# Patient Record
Sex: Female | Born: 1985 | Race: White | Hispanic: No | Marital: Single | Smoking: Current every day smoker
Health system: Southern US, Community
[De-identification: ages and names within clinical notes are randomized; demographics above are authoritative.]

## PROBLEM LIST (undated history)

## (undated) DIAGNOSIS — F329 Major depressive disorder, single episode, unspecified: Secondary | ICD-10-CM

## (undated) DIAGNOSIS — T7840XA Allergy, unspecified, initial encounter: Secondary | ICD-10-CM

## (undated) DIAGNOSIS — F32A Depression, unspecified: Secondary | ICD-10-CM

## (undated) DIAGNOSIS — F419 Anxiety disorder, unspecified: Secondary | ICD-10-CM

## (undated) DIAGNOSIS — F1910 Other psychoactive substance abuse, uncomplicated: Secondary | ICD-10-CM

## (undated) HISTORY — DX: Allergy, unspecified, initial encounter: T78.40XA

## (undated) HISTORY — DX: Other psychoactive substance abuse, uncomplicated: F19.10

## (undated) HISTORY — PX: APPENDECTOMY: SHX54

## (undated) HISTORY — DX: Major depressive disorder, single episode, unspecified: F32.9

## (undated) HISTORY — DX: Depression, unspecified: F32.A

## (undated) HISTORY — DX: Anxiety disorder, unspecified: F41.9

---

## 2015-07-25 ENCOUNTER — Ambulatory Visit (HOSPITAL_COMMUNITY)
Admission: RE | Admit: 2015-07-25 | Discharge: 2015-07-25 | Disposition: A | Discharge status: Home / Self Care | Payer: BLUE CROSS/BLUE SHIELD | Attending: Psychiatry | Admitting: Psychiatry

## 2015-07-25 DIAGNOSIS — Z915 Personal history of self-harm: Secondary | ICD-10-CM | POA: Insufficient documentation

## 2015-07-25 DIAGNOSIS — F1220 Cannabis dependence, uncomplicated: Secondary | ICD-10-CM | POA: Insufficient documentation

## 2015-07-25 DIAGNOSIS — F4321 Adjustment disorder with depressed mood: Secondary | ICD-10-CM | POA: Diagnosis present

## 2015-07-25 NOTE — BH Assessment (Signed)
Consulted with May Agustin, NP who recommends patient be discharged back to outpatient psychiatrist and follow-up with a therapist.  Patients girlfriend states that she "monitors" patient and feels that she is safe and Dr. Nolen MuMcKinney called and has spoke with the patient since being here at [facility]. Patient contracts for safety and provided information for mobile crisis and suicide prevention information.   Davina PokeJoVea Tadao Emig, LCSW Therapeutic Triage Specialist [facility] 07/25/2015 6:23 PM
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
## 2015-07-30 ENCOUNTER — Other Ambulatory Visit (HOSPITAL_COMMUNITY): Payer: BLUE CROSS/BLUE SHIELD | Attending: Psychiatry | Admitting: Psychiatry

## 2015-07-30 ENCOUNTER — Encounter (HOSPITAL_COMMUNITY): Payer: Self-pay | Admitting: Psychiatry

## 2015-07-30 DIAGNOSIS — G4710 Hypersomnia, unspecified: Secondary | ICD-10-CM | POA: Diagnosis not present

## 2015-07-30 DIAGNOSIS — F331 Major depressive disorder, recurrent, moderate: Secondary | ICD-10-CM | POA: Insufficient documentation

## 2015-07-30 DIAGNOSIS — F419 Anxiety disorder, unspecified: Secondary | ICD-10-CM | POA: Insufficient documentation

## 2015-07-30 NOTE — Addendum Note (Signed)
Addended by: Sheralyn BoatmanLARK, MARGUERITE P on: 07/30/2015 02:44 PM   Modules accepted: Medications

## 2015-07-30 NOTE — Progress Notes (Signed)
    Daily Group Progress Note  Program: IOP  Group Time: 9:00-10:30  Participation Level: Active  Behavioral Response: Appropriate  Type of Therapy:  Group Therapy  Summary of Progress: Pt. Met with case manager and psychiatrist for intake assessment.     Group Time: 10:30-12:00  Participation Level:  Active  Behavioral Response: Appropriate  Type of Therapy: Psycho-education Group  Summary of Progress: Pt. Presents as quiet, primarily flat affect, depressed. Pt. Shared that she moved to th area in June with her partner and misses her family. Pt. Watched the [employer] and participated in breath focused meditation exercise.   Eloise Levels, Ph.D., [facility]
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
## 2015-07-31 ENCOUNTER — Other Ambulatory Visit (HOSPITAL_COMMUNITY): Payer: BLUE CROSS/BLUE SHIELD | Admitting: Psychiatry

## 2015-07-31 DIAGNOSIS — F331 Major depressive disorder, recurrent, moderate: Secondary | ICD-10-CM | POA: Diagnosis not present

## 2015-08-01 ENCOUNTER — Other Ambulatory Visit (HOSPITAL_COMMUNITY): Payer: BLUE CROSS/BLUE SHIELD | Admitting: Psychiatry

## 2015-08-01 DIAGNOSIS — F331 Major depressive disorder, recurrent, moderate: Secondary | ICD-10-CM

## 2015-08-01 NOTE — Progress Notes (Signed)
    Daily Group Progress Note  Program: IOP  Group Time: 9:00-10:30  Participation Level: Minimal  Behavioral Response: Appropriate  Type of Therapy:  Group Therapy  Summary of Progress: Pt. Presented as quiet and guarded, does not share in group, primarily flat affect and disengaged from group process. Pt. Shared minimally regarding her past experience with DBT therapy.      Group Time: 10:30-12:00  Participation Level:  None  Behavioral Response: Appropriate  Type of Therapy: Psycho-education Group  Summary of Progress: Pt. Was excused from group due to previously scheduled appointment.  Dawn Brown, Dawn Brown, LPC

## 2015-08-01 NOTE — Progress Notes (Signed)
    Daily Group Progress Note  Program: IOP  Group Time: 9:00-10:30  Participation Level: Active  Behavioral Response: Appropriate  Type of Therapy:  Group Therapy  Summary of Progress: Pt. Presented with mildly brightened affect, primarily depressed with flat affect. Pt. Discussed that her job is stressor due to exposure to people with substance dependence. Pt. 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 Pt. Discussed relationship with individual therapist and would like referral to another therapist.     Group Time: 10:30-12:00  Participation Level:  Active  Behavioral Response: Appropriate  Type of Therapy: Psycho-education Group  Summary of Progress: Pt. Watched and discussed Haze RushingKristen Neff video about the components of self-compassion i.e. Kindness to self, recognition of common humanity, and mindfulness.  Shaune PollackBrown, Jennifer B, LPC

## 2015-08-02 ENCOUNTER — Other Ambulatory Visit (HOSPITAL_COMMUNITY): Payer: BLUE CROSS/BLUE SHIELD

## 2015-08-05 ENCOUNTER — Other Ambulatory Visit (HOSPITAL_COMMUNITY): Payer: BLUE CROSS/BLUE SHIELD | Admitting: Psychiatry

## 2015-08-06 ENCOUNTER — Other Ambulatory Visit (HOSPITAL_COMMUNITY): Payer: BLUE CROSS/BLUE SHIELD | Admitting: Psychiatry

## 2015-08-06 DIAGNOSIS — F331 Major depressive disorder, recurrent, moderate: Secondary | ICD-10-CM | POA: Diagnosis not present

## 2015-08-06 NOTE — Progress Notes (Signed)
    Daily Group Progress Note  Program: IOP  Group Time: 9:00-10:30  Participation Level: Minimal  Behavioral Response: Appropriate  Type of Therapy:  Group Therapy  Summary of Progress: Pt. Presents as quiet, flat affect, does not participate in group process. When asked how she was doing Pt. Reported "ok", appeared preoccupied by her cell phone.     Group Time: 10:30-12:00  Participation Level:  Minimal  Behavioral Response: Appropriate  Type of Therapy: Psycho-education Group  Summary of Progress: Pt. Watched video about seeking emotional support ("nail in forehead"). Pt. Continues to present with primarily flat affect, not sharing in group process.  Shaune PollackBrown, Jennifer B, LPC

## 2015-08-07 ENCOUNTER — Other Ambulatory Visit (HOSPITAL_COMMUNITY): Payer: BLUE CROSS/BLUE SHIELD | Admitting: Psychiatry

## 2015-08-07 DIAGNOSIS — F331 Major depressive disorder, recurrent, moderate: Secondary | ICD-10-CM | POA: Diagnosis not present

## 2015-08-08 ENCOUNTER — Other Ambulatory Visit (HOSPITAL_COMMUNITY): Payer: BLUE CROSS/BLUE SHIELD

## 2015-08-08 NOTE — Progress Notes (Signed)
    Daily Group Progress Note  Program: IOP  Group Time: 9:00-10:30  Participation Level: Active  Behavioral Response: Appropriate  Type of Therapy:  Group Therapy  Summary of Progress: Pt. Presents with primarily flat affect, depressed, challenged to engage in the group process. Pt. 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     Group Time: 10:30-12:00  Participation Level:  Active  Behavioral Response: Appropriate  Type of Therapy: Psycho-education Group  Summary of Progress: Pt. Participated in discussion about developing a meditation practice and instruction about RAIN meditation method i.e., recognize, allow, investigate, and non-identification.  Shaune PollackBrown, Jennifer B, LPC

## 2015-08-09 ENCOUNTER — Other Ambulatory Visit (HOSPITAL_COMMUNITY): Payer: BLUE CROSS/BLUE SHIELD

## 2015-08-13 ENCOUNTER — Other Ambulatory Visit (HOSPITAL_COMMUNITY): Payer: BLUE CROSS/BLUE SHIELD

## 2015-08-14 ENCOUNTER — Other Ambulatory Visit (HOSPITAL_COMMUNITY): Payer: BLUE CROSS/BLUE SHIELD

## 2015-08-15 ENCOUNTER — Other Ambulatory Visit (HOSPITAL_COMMUNITY): Payer: BLUE CROSS/BLUE SHIELD | Admitting: Psychiatry

## 2015-08-15 DIAGNOSIS — F331 Major depressive disorder, recurrent, moderate: Secondary | ICD-10-CM

## 2015-08-15 NOTE — Progress Notes (Signed)
Patient ID: Dawn Brown, female   DOB: 01/02/1986, 29 y.o.   MRN: [id number] Discharge Note  Patient:  Dawn Brown is an 29 y.o., female DOB:  01/10/1986  Date of Admission:  07/30/2015  Date of Discharge:  08/16/2015  Reason for Admission:depression  IOP Course:  Ms Dawn Brown attended and participated in groups. She says there is really no change from when she was admitted. She feels the same but it was helpful to have a place to come where she felt safe and accepted every day.  She has an upcoming appointment with her psychiatrist as well as with a therapist and believes they might be helpful.  She had a good trip to [place].  She believes if she can stick it out things will get better for her.  She still has suicidal thoughts but no plan or intent  Mental Status at Discharge:still depressed. Still has suicidal but no plan or intent.  Lab Results: No results found for this or any previous visit (from the past 48 hour(s)).   Current outpatient prescriptions:  .  LORazepam (ATIVAN) 2 MG tablet, Take 2 mg by mouth every 6 (six) hours as needed for anxiety., Disp: , Rfl:  .  lurasidone (LATUDA) 40 MG TABS tablet, Take 60 mg by mouth daily with breakfast. Take 60 mg daily, Disp: , Rfl:  .  sertraline (ZOLOFT) 50 MG tablet, Take 50 mg by mouth daily., Disp: , Rfl:   Axis Diagnosis:  Major depression, recurrent, severe without psychotic features   Level of Care:  IOP  Discharge destination:  Other:  has appointments with her psychiatrist and a therapist  Is patient on multiple antipsychotic therapies at discharge:  No    Has Patient had three or more failed trials of antipsychotic monotherapy by history:  Negative  Patient phone:  [phone] (home)  Patient address:   [address] [place] [address],   Follow-up recommendations:  Activity:  continue current activity Diet:  continue current diet  Comments:  none  The patient received suicide prevention pamphlet:   Yes Belongings returned:    Dawn Brown 08/15/2015, 9:27 AM

## 2015-08-15 NOTE — Progress Notes (Signed)
    Daily Group Progress Note  Program: IOP  Group Time: 0900-1045  Participation Level: Minimal  Behavioral Response: Appropriate and Passive-Aggressive  Type of Therapy:  Group Therapy  Summary of Progress: Patient voiced that she is no better by being in MH-IOP.  Only spoke when called upon.  Writer discovered that pt hadn't shared her story as to why she's in MH-IOP.  Patients wanted to know why pt was attending.  Encouraged pt to share.  Pt stated it was too long of a story, but did share reader's digest version.     Group Time: 1100-1200  Participation Level:  Minimal  Behavioral Response: Passive-Aggressive  Type of Therapy: Psycho-education Group  Summary of Progress: Warning Signals of Relapse:  Discussed the various signs and signals of relapse and steps to take towards recovery.  Then later guest speaker:  Chester HolsteinStephanie Rhodes, RN, CPSS [facility]) discussed what her organization provides for free within the community.  Mentioned the orientation process and was able to schedule an appointment.  Also, she went on to share her story after answering several questions.      Jeri Modenaita Savan Ruta, M.Ed, CNA

## 2015-08-16 ENCOUNTER — Telehealth (HOSPITAL_COMMUNITY): Payer: Self-pay | Admitting: Psychiatry

## 2015-08-16 ENCOUNTER — Other Ambulatory Visit (HOSPITAL_COMMUNITY): Payer: BLUE CROSS/BLUE SHIELD | Admitting: Psychiatry

## 2015-08-16 DIAGNOSIS — F331 Major depressive disorder, recurrent, moderate: Secondary | ICD-10-CM | POA: Diagnosis not present

## 2015-08-16 NOTE — Patient Instructions (Signed)
Pt didn't return to MH-IOP for discharge, nor did she call.  F/U with Dr. Nolen MuMcKinney 08-21-15 @ 10 45 a.m and Forde RadonLeAnne Yates, LPC on 09-09-15 @ 9 a.m.  Encouraged support groups; especially DBT.  Return to work on 08-17-15; without any restrictions.

## 2015-08-16 NOTE — Progress Notes (Signed)
Dawn PeckKristen Brown is a 29 yo, divorced, employed, Caucasian female who was referred per TTS; treatment for worsening depressive and anxiety symptoms. Pt admitted to vague SI, no plan or intent. Admitted to previous suicide attempts. Hx of self mutilative behaviors (cutting and burning). Reported hx of twelve inpatient psychiatric hospitalizations due to depression and suicide gestures. Pt was able to contract for safety upon admission. Denied HI or A/V hallucinations. 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 Pt states the job is very taxing and emotional because there isn't enough resources for the patients. Also, mentioned that the addict patients trigger her recovery. 2) Family: States she is missing her family in [place]. [place] with [place] partner of one yr, after she moved here in April 2016.  Pt was scheduled to complete MH-IOP today, but she didn't return nor did she call.  Placed call to pt, but there was no answer.  Left f/u appts on her vm.  Yesterday was patient's first day sharing a little in the group for the first time.  Pt stated yesterday that the groups weren't helpful and she isn't any better.  Denied SI/HI or A/V hallucinations yesterday.  A:  D/C today.  F/U with Dr. Nolen MuMcKinney on 08-21-15 @ 1045 and Forde RadonLeAnne Yates, [facility] on 09-09-15 @ 9 a.m.  Encouraged support groups; especially DBT groups.  R:  Pt receptive.      Chestine SporeLARK, RITA, M.Ed, CNA

## 2015-08-20 ENCOUNTER — Other Ambulatory Visit (HOSPITAL_COMMUNITY): Payer: BLUE CROSS/BLUE SHIELD

## 2015-09-09 ENCOUNTER — Ambulatory Visit (HOSPITAL_COMMUNITY): Payer: BLUE CROSS/BLUE SHIELD | Admitting: Psychology

## 2015-10-31 ENCOUNTER — Ambulatory Visit (INDEPENDENT_AMBULATORY_CARE_PROVIDER_SITE_OTHER): Payer: BLUE CROSS/BLUE SHIELD | Admitting: Urgent Care

## 2015-10-31 VITALS — BP 118/80 | HR 67 | Temp 98.3°F | Resp 16 | Ht 71.0 in | Wt 191.0 lb

## 2015-10-31 DIAGNOSIS — Z862 Personal history of diseases of the blood and blood-forming organs and certain disorders involving the immune mechanism: Secondary | ICD-10-CM

## 2015-10-31 DIAGNOSIS — Z111 Encounter for screening for respiratory tuberculosis: Secondary | ICD-10-CM

## 2015-10-31 DIAGNOSIS — F418 Other specified anxiety disorders: Secondary | ICD-10-CM

## 2015-10-31 DIAGNOSIS — F32A Depression, unspecified: Secondary | ICD-10-CM

## 2015-10-31 DIAGNOSIS — F419 Anxiety disorder, unspecified: Secondary | ICD-10-CM

## 2015-10-31 DIAGNOSIS — Z0000 Encounter for general adult medical examination without abnormal findings: Secondary | ICD-10-CM

## 2015-10-31 DIAGNOSIS — F329 Major depressive disorder, single episode, unspecified: Secondary | ICD-10-CM

## 2015-10-31 LAB — COMPREHENSIVE METABOLIC PANEL
ALT: 20 U/L (ref 6–29)
AST: 18 U/L (ref 10–30)
Albumin: 4.5 g/dL (ref 3.6–5.1)
Alkaline Phosphatase: 58 U/L (ref 33–115)
BUN: 15 mg/dL (ref 7–25)
CHLORIDE: 103 mmol/L (ref 98–110)
CO2: 27 mmol/L (ref 20–31)
CREATININE: 0.76 mg/dL (ref 0.50–1.10)
Calcium: 9.9 mg/dL (ref 8.6–10.2)
GLUCOSE: 97 mg/dL (ref 65–99)
Potassium: 4.3 mmol/L (ref 3.5–5.3)
SODIUM: 137 mmol/L (ref 135–146)
Total Bilirubin: 0.3 mg/dL (ref 0.2–1.2)
Total Protein: 6.9 g/dL (ref 6.1–8.1)

## 2015-10-31 LAB — CBC
HCT: 38.2 % (ref 36.0–46.0)
Hemoglobin: 12.7 g/dL (ref 12.0–15.0)
MCH: 28.7 pg (ref 26.0–34.0)
MCHC: 33.2 g/dL (ref 30.0–36.0)
MCV: 86.2 fL (ref 78.0–100.0)
MPV: 10.5 fL (ref 8.6–12.4)
PLATELETS: 217 10*3/uL (ref 150–400)
RBC: 4.43 MIL/uL (ref 3.87–5.11)
RDW: 13.9 % (ref 11.5–15.5)
WBC: 6 10*3/uL (ref 4.0–10.5)

## 2015-10-31 LAB — FOLATE: Folate: 7.8 ng/mL (ref >5.4)

## 2015-10-31 LAB — FERRITIN: Ferritin: 53 ng/mL (ref 10–154)

## 2015-10-31 LAB — LIPID PANEL
CHOL/HDL RATIO: 2.9 ratio (ref <=5.0)
Cholesterol: 169 mg/dL (ref 125–200)
HDL: 59 mg/dL (ref >=46)
LDL Cholesterol: 97 mg/dL (ref <130)
Triglycerides: 67 mg/dL (ref <150)
VLDL: 13 mg/dL (ref <30)

## 2015-10-31 LAB — TSH: TSH: 1.55 mIU/L

## 2015-10-31 LAB — VITAMIN B12: Vitamin B-12: 1161 pg/mL — ABNORMAL HIGH (ref 200–1100)

## 2015-10-31 NOTE — Progress Notes (Signed)
  Tuberculosis Risk Questionnaire  1. No Were you born outside the [place] in one of the following parts of the world: [place], [place], [place], [place] or [place]?    2. No Have you traveled outside the [place] and lived for more than one month in one of the following parts of the world: [place], [place], [place], [place] or [place]?    3. No Do you have a compromised immune system such as from any of the following conditions:HIV/AIDS, organ or bone marrow transplantation, diabetes, immunosuppressive medicines (e.g. Prednisone, Remicaide), leukemia, lymphoma, cancer of the head or neck, gastrectomy or jejunal bypass, end-stage renal disease (on dialysis), or silicosis?     4.yes Have you ever or do you plan on working in: a residential care center, a health care facility, a jail or prison or homeless shelter?    5. no Have you ever: injected illegal drugs, used crack cocaine, lived in a homeless shelter  or been in jail or prison?     6. No Have you ever been exposed to anyone with infectious tuberculosis?    Tuberculosis Symptom Questionnaire  Do you currently have any of the following symptoms?  1. No Unexplained cough lasting more than 3 weeks?   2. No Unexplained fever lasting more than 3 weeks.   3. No Night Sweats (sweating that leaves the bedclothes and sheets wet)     4. No Shortness of Breath   5. No Chest Pain   6. No Unintentional weight loss    7. No Unexplained fatigue (very tired for no reason)

## 2015-10-31 NOTE — Progress Notes (Signed)
MRN: [id number]  Subjective:   Dawn Brown is a 30 y.o. female presenting for annual physical exam and Tb screen.  Medical care team includes: PCP: Astrid Divine, MD, plans on establishing care with Dr. Valentina Lucks in June 2017.  Vision: No visual deficits, does not see an eye doctor. Dental: Has dental insurance, does not get dental care. OB/GYN: Last pap smear completed 2015, was normal. She has never had an abnormal pap smear.   Specialists: Psychiatry - sees Dr. Nolen Mu. Treats patient for depression and anxiety. Managed with Latuda, Ativan and Zoloft. Her psychiatrist requested blood work for today's annual exam.  Patient works with pre-schoolers. She needs tuberculosis screening. She is currently living with her girlfriend, divorced 2010. She has good support network. Eats mixed diet, does not exercise. Smokes 1/2 ppd for the past 11 years. Not interested in quitting. Has ~2 drinks of alcohol per week. Does not use any drugs.   Dawn Brown has Depression, major, recurrent, moderate (HCC) on her problem list.  Dawn Brown has a current medication list which includes the following prescription(s): lorazepam, lurasidone, and sertraline. She is allergic to penicillins.  Dawn Brown  has a past medical history of Depression; Anxiety; Allergy; and Substance abuse. Also  has past surgical history that includes Appendectomy.  family history includes Alcohol abuse in her paternal grandfather; Cancer in her paternal grandfather; Diabetes in her paternal grandmother; Heart disease in her maternal grandfather, maternal grandmother, and paternal grandmother.  Immunizations: TDAP 2015  Review of Systems  Constitutional: Negative for fever, chills, weight loss, malaise/fatigue and diaphoresis.  HENT: Negative for congestion, ear discharge, ear pain, hearing loss, nosebleeds, sore throat and tinnitus.   Eyes: Negative for blurred vision, double vision, photophobia, pain, discharge and redness.   Respiratory: Negative for cough, shortness of breath and wheezing.   Cardiovascular: Negative for chest pain, palpitations and leg swelling.  Gastrointestinal: Negative for nausea, vomiting, abdominal pain, diarrhea, constipation and blood in stool.  Genitourinary: Negative for dysuria, urgency, frequency, hematuria and flank pain.  Musculoskeletal: Negative for myalgias, back pain and joint pain.  Skin: Negative for itching and rash.  Neurological: Negative for dizziness, tingling, seizures, loss of consciousness, weakness and headaches.  Endo/Heme/Allergies: Negative for polydipsia.  Psychiatric/Behavioral: Negative for depression, suicidal ideas, hallucinations, memory loss and substance abuse. The patient is not nervous/anxious and does not have insomnia.    Objective:   Vitals: BP 118/80 mmHg  Pulse 67  Temp(Src) 98.3 F (36.8 C) (Oral)  Resp 16  Ht  (1.803 m)  Wt 191 lb (86.637 kg)  BMI 26.65 kg/m2  SpO2 97%  LMP 10/29/2015 (Exact Date)  Physical Exam  Constitutional: She is oriented to person, place, and time. She appears well-developed and well-nourished.  HENT:  TM's intact bilaterally, no effusions or erythema. Nasal turbinates pink and moist, nasal passages patent. No sinus tenderness. Oropharynx clear, mucous membranes moist, dentition in good repair. Patient has lower lip ring.  Eyes: Conjunctivae and EOM are normal. Pupils are equal, round, and reactive to light. Right eye exhibits no discharge. Left eye exhibits no discharge. No scleral icterus.  Neck: Normal range of motion. Neck supple. No thyromegaly present.  Cardiovascular: Normal rate, regular rhythm and intact distal pulses.  Exam reveals no gallop and no friction rub.   No murmur heard. Pulmonary/Chest: No respiratory distress. She has no wheezes. She has no rales.  Abdominal: Soft. Bowel sounds are normal. She exhibits no distension and no mass. There is no tenderness.  Musculoskeletal: Normal  range  of motion. She exhibits no edema or tenderness.  Lymphadenopathy:    She has no cervical adenopathy.  Neurological: She is alert and oriented to person, place, and time. She has normal reflexes.  Skin: Skin is warm and dry. No rash noted. No erythema. No pallor.  Tattoos on both forearms.  Psychiatric: She has a normal mood and affect.   Assessment and Plan :   1. Annual physical exam - Patient is medically healthy. Counseled on risks of smoking. She is not interested in quitting. Discussed healthy lifestyle, diet, exercise, preventative care, vaccinations, and addressed patient's concerns.   2. Anxiety and depression - Continue f/u with Dr. Nolen MuMcKinney, labs pending. Patient will request results when she returns for her PPD reading.  3. History of anemia - Labs pending.  4. Screening for tuberculosis - TB Skin Test   Wallis BambergMario Illyria Sobocinski, PA-C [facility] [phone] 10/31/2015  9:48 AM

## 2015-10-31 NOTE — Patient Instructions (Addendum)
   IF you received an x-ray today, you will receive an invoice from [facility]. Please contact [facility] at [phone] with questions or concerns regarding your invoice.   IF you received labwork today, you will receive an invoice from [facility]. Please contact [facility] at [phone] with questions or concerns regarding your invoice.   Our billing staff will not be able to assist you with questions regarding bills from these companies.  You will be contacted with the lab results as soon as they are available. The fastest way to get your results is to activate your My Chart account. Instructions are located on the last page of this paperwork. If you have not heard from us regarding the results in 2 weeks, please contact this office.     Keeping You Healthy  Get These Tests 1. Blood Pressure- Have your blood pressure checked once a year by your health care provider.  Normal blood pressure is 120/80. 2. Weight- Have your body mass index (BMI) calculated to screen for obesity.  BMI is measure of body fat based on height and weight.  You can also calculate your own BMI at www.nhlbisupport.com/bmi/. 3. Cholesterol- Have your cholesterol checked every 5 years starting at age 20 then yearly starting at age 45. 4. Chlamydia, HIV, and other sexually transmitted diseases- Get screened every year until age 25, then within three months of each new sexual provider. 5. Pap Test - Every 1-5 years; discuss with your health care provider. 6. Mammogram- Every 1-2 years starting at age 40--50  Take these medicines  Calcium with Vitamin D-Your body needs 1200 mg of Calcium each day and 800-1000 IU of Vitamin D daily.  Your body can only absorb 500 mg of Calcium at a time so Calcium must be taken in 2 or 3 divided doses throughout the day.  Multivitamin with folic acid- Once daily if it is possible for you to become pregnant.  Get these  Immunizations  Gardasil-Series of three doses; prevents HPV related illness such as genital warts and cervical cancer.  Menactra-Single dose; prevents meningitis.  Tetanus shot- Every 10 years.  Flu shot-Every year.  Take these steps 1. Do not smoke-Your healthcare provider can help you quit.  For tips on how to quit go to www.smokefree.gov or call 1-800 QUITNOW. 2. Be physically active- Exercise 5 days a week for at least 30 minutes.  If you are not already physically active, start slow and gradually work up to 30 minutes of moderate physical activity.  Examples of moderate activity include walking briskly, dancing, swimming, bicycling, etc. 3. Breast Cancer- A self breast exam every month is important for early detection of breast cancer.  For more information and instruction on self breast exams, ask your healthcare provider or www.womenshealth.gov/faq/breast-self-exam.cfm. 4. Eat a healthy diet- Eat a variety of healthy foods such as fruits, vegetables, whole grains, low fat milk, low fat cheeses, yogurt, lean meats, poultry and fish, beans, nuts, tofu, etc.  For more information go to www. Thenutritionsource.org 5. Drink alcohol in moderation- Limit alcohol intake to one drink or less per day. Never drink and drive. 6. Depression- Your emotional health is as important as your physical health.  If you're feeling down or losing interest in things you normally enjoy please talk to your healthcare provider about being screened for depression. 7. Dental visit- Brush and floss your teeth twice daily; visit your dentist twice a year. 8. Eye doctor- Get an eye exam at least every   2 years. 9. Helmet use- Always wear a helmet when riding a bicycle, motorcycle, rollerblading or skateboarding. 10. Safe sex- If you may be exposed to sexually transmitted infections, use a condom. 11. Seat belts- Seat belts can save your live; always wear one. 12. Smoke/Carbon Monoxide detectors- These detectors need to  be installed on the appropriate level of your home. Replace batteries at least once a year. 13. Skin cancer- When out in the sun please cover up and use sunscreen 15 SPF or higher. 14. Violence- If anyone is threatening or hurting you, please tell your healthcare provider.        

## 2015-11-02 ENCOUNTER — Ambulatory Visit (INDEPENDENT_AMBULATORY_CARE_PROVIDER_SITE_OTHER): Payer: BLUE CROSS/BLUE SHIELD | Admitting: *Deleted

## 2015-11-02 DIAGNOSIS — Z111 Encounter for screening for respiratory tuberculosis: Secondary | ICD-10-CM

## 2015-11-02 LAB — TB SKIN TEST
INDURATION: 0 mm
TB Skin Test: NEGATIVE

## 2015-11-02 NOTE — Patient Instructions (Signed)
     IF you received an x-ray today, you will receive an invoice from [facility]. Please contact [facility] at [phone] with questions or concerns regarding your invoice.   IF you received labwork today, you will receive an invoice from [facility]. Please contact [facility] at [phone] with questions or concerns regarding your invoice.   Our billing staff will not be able to assist you with questions regarding bills from these companies.  You will be contacted with the lab results as soon as they are available. The fastest way to get your results is to activate your My Chart account. Instructions are located on the last page of this paperwork. If you have not heard from us regarding the results in 2 weeks, please contact this office.      

## 2015-11-04 LAB — VITAMIN D 1,25 DIHYDROXY
VITAMIN D 1, 25 (OH) TOTAL: 73 pg/mL — AB (ref 18–72)
VITAMIN D3 1, 25 (OH): 73 pg/mL
Vitamin D2 1, 25 (OH)2: <8 pg/mL

## 2015-11-08 ENCOUNTER — Encounter: Payer: Self-pay | Admitting: *Deleted

## 2015-12-03 ENCOUNTER — Telehealth: Payer: Self-pay

## 2015-12-03 NOTE — Telephone Encounter (Signed)
Pt  Dropped off pe form to be completed from physical she had 2 wks ago Pt  Will pick up   Best phone for pt is [phone]

## 2015-12-03 NOTE — Telephone Encounter (Signed)
Placed in Mani's box.

## 2015-12-04 NOTE — Telephone Encounter (Signed)
Completed and placed in to be faxed bin

## 2016-01-27 ENCOUNTER — Ambulatory Visit (INDEPENDENT_AMBULATORY_CARE_PROVIDER_SITE_OTHER): Payer: BLUE CROSS/BLUE SHIELD

## 2016-01-27 ENCOUNTER — Ambulatory Visit (INDEPENDENT_AMBULATORY_CARE_PROVIDER_SITE_OTHER): Payer: BLUE CROSS/BLUE SHIELD | Admitting: Family Medicine

## 2016-01-27 VITALS — BP 124/82 | HR 88 | Temp 97.2°F | Resp 17 | Ht 71.0 in | Wt 193.0 lb

## 2016-01-27 DIAGNOSIS — M25571 Pain in right ankle and joints of right foot: Secondary | ICD-10-CM | POA: Diagnosis not present

## 2016-01-27 DIAGNOSIS — S8261XA Displaced fracture of lateral malleolus of right fibula, initial encounter for closed fracture: Secondary | ICD-10-CM

## 2016-01-27 NOTE — Progress Notes (Signed)
Patient ID: Dawn Brown, female    DOB: 05/05/1986  Age: 30 y.o. MRN: [id number]  Chief Complaint  Patient presents with  . Foot Pain    right   . 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 This morning she stepped wrong on a step and turned her right foot. She fell with it and felt a pop and pain in the lateral aspect of the right midfoot. She has had a prior sprain of the ankle some time ago.  She was positive on the depression screen, has a long chronic history of depression and is under treatment for that. Nothing is new.   Current allergies, medications, problem list, past/family and social histories reviewed.  Objective:  BP 124/82 mmHg  Pulse 88  Temp(Src) 97.2 F (36.2 C) (Oral)  Resp 17  Ht  (1.803 m)  Wt 193 lb (87.544 kg)  BMI 26.93 kg/m2  SpO2 99%  LMP 01/20/2016  Healthy-appearing young lady. Foot is not grossly swollen or deformed. Has 2 top of the foot. She has fairly prominent lateral malleolus. The malleolus itself is nontender. She is not tender directly adjacent little further distally on the lateral aspect of the right foot she is quite tender. That tenderness extends to the proximal end of the fifth metatarsal. Motion of toes is good. Pulses good.  Assessment & Plan:   Assessment: 1. Pain in joint, ankle and foot, right   2. Fractured lateral malleolus, right, closed, initial encounter       Plan: X-ray foot and proceed from there.  Orders Placed This Encounter  Procedures  . DG Foot Complete Right    Order Specific Question:  Reason for Exam (SYMPTOM  OR DIAGNOSIS REQUIRED)    Answer:  turned foot, pain lateral midfoot on right    Order Specific Question:  Is the patient pregnant?    Answer:  No     Comments:  lmp last week    Order Specific Question:  Preferred imaging location?    Answer:  External  . Ambulatory referral to Orthopedic Surgery    Referral Priority:  Routine    Referral Type:  Surgical     Referral Reason:  Specialty Services Required    Requested Specialty:  Orthopedic Surgery    Number of Visits Requested:  1    No orders of the defined types were placed in this encounter.    X-ray reveals what is probably an old fracture of the distal lateral malleolus. Her tenderness is distal to the area of that fracture. However I think she needs to see an orthopedist for this.     Patient Instructions   Take either Aleve 2 pills twice daily or ibuprofen 200 mg 3-4 pills 3 times daily as needed for pain  Elevated as possible  Apply ice for 15 or 20 minutes several times daily for the next few days  Stay off work through tomorrow. If you need [place] to extend that excuse let me know. You should be doing minimal weightbearing activity, which may make it if called for you to do your job at all. I do not know if they have any light duty for you.  Referral is being made to orthopedics  Return as needed    IF you received an x-ray today, you will receive an invoice from [facility]. Please contact [facility] at [phone] with questions or concerns regarding your invoice.   IF you received labwork  today, you will receive an invoice from [facility]. Please contact [facility] at [phone] with questions or concerns regarding your invoice.   Our billing staff will not be able to assist you with questions regarding bills from these companies.  You will be contacted with the lab results as soon as they are available. The fastest way to get your results is to activate your My Chart account. Instructions are located on the last page of this paperwork. If you have not heard from us regarding the results in 2 weeks, please contact this office.          Return if symptoms worsen or fail to improve.   Elif Yonts, MD 01/27/2016

## 2016-01-27 NOTE — Patient Instructions (Addendum)
Take either Aleve 2 pills twice daily or ibuprofen 200 mg 3-4 pills 3 times daily as needed for pain  Elevated as possible  Apply ice for 15 or 20 minutes several times daily for the next few days  Stay off work through tomorrow. If you need us to extend that excuse let me know. You should be doing minimal weightbearing activity, which may make it if called for you to do your job at all. I do not know if they have any light duty for you.  Referral is being made to orthopedics  Return as needed    IF you received an x-ray today, you will receive an [facility]. Please contact [facility] at [phone] with questions or concerns regarding your invoice.   IF you received labwork today, you will receive an invoice from [facility]. Please contact [facility] at [phone] with questions or concerns regarding your invoice.   Our billing staff will not be able to assist you with questions regarding bills from these companies.  You will be contacted with the lab results as soon as they are available. The fastest way to get your results is to activate your My Chart account. Instructions are located on the last page of this paperwork. If you have not heard from us regarding the results in 2 weeks, please contact this office.

## 2016-08-24 ENCOUNTER — Ambulatory Visit (INDEPENDENT_AMBULATORY_CARE_PROVIDER_SITE_OTHER): Payer: BLUE CROSS/BLUE SHIELD | Admitting: Physician Assistant

## 2016-08-24 VITALS — BP 118/78 | HR 89 | Resp 16 | Wt 200.0 lb

## 2016-08-24 DIAGNOSIS — Z111 Encounter for screening for respiratory tuberculosis: Secondary | ICD-10-CM | POA: Diagnosis not present

## 2016-08-24 NOTE — Progress Notes (Signed)

## 2016-08-24 NOTE — Progress Notes (Signed)
     Patient ID: Dawn Brown, female    DOB: 07/08/1986, 31 y.o.   MRN: [id number]  PCP: No primary care provider on file.  Chief Complaint  Patient presents with  . tb test    Subjective:   Needs a TB skin test to start a new job. No previous positive skin test.    Review of Systems     Patient Active Problem List   Diagnosis Date Noted  . Depression, major, recurrent, moderate (HCC) 07/30/2015    Class: Chronic     Prior to Admission medications   Medication Sig Start Date End Date Taking? Authorizing Provider  LORazepam (ATIVAN) 2 MG tablet Take 2 mg by mouth every 6 (six) hours as needed for anxiety.   Yes Historical Provider, MD  lurasidone (LATUDA) 40 MG TABS tablet Take 80 mg by mouth daily with breakfast. Take 60 mg daily    Yes Historical Provider, MD  rOPINIRole (REQUIP) 2 MG tablet Take 2 mg by mouth at bedtime.   Yes Historical Provider, MD  sertraline (ZOLOFT) 50 MG tablet Take 50 mg by mouth daily.    Historical Provider, MD     Allergies  Allergen Reactions  . Penicillins        Objective:  Physical Exam  Constitutional: She is oriented to person, place, and time. She appears well-developed and well-nourished. She is active and cooperative. No distress.  BP 118/78 (Cuff Size: Normal)   Pulse 89   Resp 16   Wt 200 lb (90.7 kg)   SpO2 98%   BMI 27.89 kg/m    Eyes: Conjunctivae are normal.  Pulmonary/Chest: Effort normal.  Neurological: She is alert and oriented to person, place, and time.  Psychiatric: She has a normal mood and affect. Her speech is normal and behavior is normal.           Assessment & Plan:   1. Screening-pulmonary TB RTC 48-72 hours for reading. - TB Skin Test   Fernande Brashelle S. Aarib Pulido, PA-C Physician Assistant-Certified Primary Care at [facility]

## 2016-08-26 ENCOUNTER — Ambulatory Visit (INDEPENDENT_AMBULATORY_CARE_PROVIDER_SITE_OTHER): Payer: BLUE CROSS/BLUE SHIELD | Admitting: *Deleted

## 2016-08-26 DIAGNOSIS — Z111 Encounter for screening for respiratory tuberculosis: Secondary | ICD-10-CM

## 2016-08-26 LAB — TB SKIN TEST
INDURATION: 0 mm
TB Skin Test: NEGATIVE

## 2016-08-26 NOTE — Progress Notes (Signed)
Pt came into office for a PPD Reading. It was negative with an 0 mm induration.

## 2016-10-21 ENCOUNTER — Ambulatory Visit (INDEPENDENT_AMBULATORY_CARE_PROVIDER_SITE_OTHER): Payer: BLUE CROSS/BLUE SHIELD | Admitting: Family Medicine

## 2016-10-21 VITALS — BP 108/80 | HR 102 | Temp 97.9°F | Resp 16 | Ht 71.0 in | Wt 222.0 lb

## 2016-10-21 DIAGNOSIS — R519 Headache, unspecified: Secondary | ICD-10-CM

## 2016-10-21 DIAGNOSIS — A084 Viral intestinal infection, unspecified: Secondary | ICD-10-CM

## 2016-10-21 DIAGNOSIS — R51 Headache: Secondary | ICD-10-CM

## 2016-10-21 MED ORDER — RANITIDINE HCL 150 MG PO TABS: 150.0000 mg | ORAL_TABLET | Freq: Two times a day (BID) | ORAL | 0 refills | Status: DC

## 2016-10-21 MED ORDER — ONDANSETRON HCL 4 MG PO TABS: 4.0000 mg | ORAL_TABLET | Freq: Three times a day (TID) | ORAL | 0 refills | Status: DC | PRN

## 2016-10-21 NOTE — Progress Notes (Signed)
Patient ID: Dawn Brown, female    DOB: 02/25/1986, 31 y.o.   MRN: [id number]  PCP: No primary care provider on file.  Chief Complaint  Patient presents with  . Emesis  . Diarrhea  . Headache    Subjective:  HPI 31 year old female presents for evaluation of abdominal discomfort with diarrhea, nausea with vomiting, and headache x 3 days.  Reports waking up  3 days ago "just not feeling well". Felt weak, nauseated with multiple episodes of vomiting and diarrhea. Has been eating and tolerating a bland diet. Last vomited yesterday. She continues to experience diarrhea. Reports abdominal discomfort. Headache also started Monday bilateral frontal. No nasal congestion, drainage, sore throat, or visual disturbance. Headache is relieved with ibuprofen.Needs a work note as she has missed work since Monday.   Social History   Social History  . Marital status: Single    Spouse name: N/A  . Number of children: 0  . Years of education: Master's in Social Work   Occupational History  . 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 Smoking status: Current Every Day Smoker    Packs/day: 0.50    Years: 12.00    Types: Cigarettes  . Smokeless tobacco: Never Used  . Alcohol use 1.2 oz/week    2 Standard drinks or equivalent per week     Comment: occasional ETOH  . Drug use: No     Comment: Last use of cocaine was March/April 2016  . Sexual activity: Not Currently   Other Topics Concern  . Not on file   Social History Narrative   Lives with her girlfriend.    Family History  Problem Relation Age of Onset  . Alcohol abuse Paternal Grandfather   . Cancer Paternal Grandfather   . Heart disease Maternal Grandmother   . Heart disease Maternal Grandfather   . Diabetes Paternal Grandmother   . Heart disease Paternal Grandmother   . Thyroid disease Mother    Review of Systems SEE HPI Patient Active Problem List   Diagnosis Date Noted  .  Depression, major, recurrent, moderate (HCC) 07/30/2015    Class: Chronic    Allergies  Allergen Reactions  . Penicillins     Prior to Admission medications   Medication Sig Start Date End Date Taking? Authorizing Provider  LORazepam (ATIVAN) 2 MG tablet Take 2 mg by mouth every 6 (six) hours as needed for anxiety.   Yes Historical Provider, MD  lurasidone (LATUDA) 40 MG TABS tablet Take 80 mg by mouth daily with breakfast. Take 60 mg daily    Yes Historical Provider, MD  rOPINIRole (REQUIP) 2 MG tablet Take 2 mg by mouth at bedtime.   Yes Historical Provider, MD    Past Medical, Surgical Family and Social History reviewed and updated.    Objective:   Today's Vitals   10/21/16 0809  BP: 108/80  Pulse: (!) 102  Resp: 16  Temp: 97.9 F (36.6 C)  TempSrc: Axillary  SpO2: 98%  Weight: 222 lb (100.7 kg)  Height: 5\' 11"  (1.803 m)    Wt Readings from Last 3 Encounters:  10/21/16 222 lb (100.7 kg)  08/24/16 200 lb (90.7 kg)  01/27/16 193 lb (87.5 kg)    Physical Exam  Constitutional: She appears well-developed and well-nourished.  Cardiovascular: Normal rate, regular rhythm, normal heart sounds and intact distal pulses.   Pulmonary/Chest: Effort normal and breath sounds normal.  Abdominal: Soft. Bowel  sounds are increased. There is splenomegaly. There is no hepatomegaly. There is tenderness in the right lower quadrant. There is no rigidity, no guarding, no CVA tenderness, no tenderness at McBurney's point and negative Murphy's sign.  Musculoskeletal: Normal range of motion.  Neurological: She is alert. She has normal strength. No sensory deficit. She displays a negative Romberg sign. GCS eye subscore is 4. GCS verbal subscore is 5. GCS motor subscore is 6.  Skin: Skin is warm and dry.  Psychiatric: She has a normal mood and affect. Her behavior is normal. Judgment and thought content normal.    Assessment & Plan:  1. Viral gastroenteritis 2. Acute nonintractable headache,  unspecified headache type  Plan: -Continue bland diet   -Zofran 4-8 mg up to 8 hours for nausea.  -Imodium AD for diarrhea. Take as directed, although avoid use more than 4 times daily as this cause constipation.  -Ranitidine 150 mg twice daily no more than 7 days or abdominal discomfort. Discontinue use one abdominal upset resolved.   Godfrey PickKimberly S. Tiburcio PeaHarris, MSN, FNP-C Primary Care at [facility] [phone]

## 2016-10-21 NOTE — Patient Instructions (Addendum)
-Zofran 4-8 mg up to 8 hours for nausea.  -Imodium AD for diarrhea. Take as directed, although avoid use more than 4 times daily as this cause constipation.  _Ranitidine 150 mg twice daily no more than 7 days or abdominal discomfort. Discontinue use one abdominal upset resolved.  IF you received an x-ray today, you will receive an invoice from [facility]. Please contact [facility] at [phone] with questions or concerns regarding your invoice.   IF you received labwork today, you will receive an invoice from [facility]. Please contact [facility] at [phone] with questions or concerns regarding your invoice.   Our billing staff will not be able to assist you with questions regarding bills from these companies.  You will be contacted with the lab results as soon as they are available. The fastest way to get your results is to activate your My Chart account. Instructions are located on the last page of this paperwork. If you have not heard from [place] regarding the results in 2 weeks, please contact this office.     Viral Gastroenteritis, Adult Viral gastroenteritis is also known as the stomach flu. This condition is caused by certain germs (viruses). These germs can be passed from person to person very easily (are very contagious). This condition can cause sudden watery poop (diarrhea), fever, and throwing up (vomiting). Having watery poop and throwing up can make you feel weak and cause you to get dehydrated. Dehydration can make you tired and thirsty, make you have a dry mouth, and make it so you pee (urinate) less often. Older adults and people with other diseases or a weak defense system (immune system) are at higher risk for dehydration. It is important to replace the fluids that you lose from having watery poop and throwing up. Follow these instructions at home: Follow instructions from your doctor about how to care for yourself at home. Eating and drinking   Follow  these instructions as told by your doctor:  Take an oral rehydration solution (ORS). This is a drink that is sold at pharmacies and stores.  Drink clear fluids in small amounts as you are able, such as:  Water.  Ice chips.  Diluted fruit juice.  Low-calorie sports drinks.  Eat bland, easy-to-digest foods in small amounts as you are able, such as:  Bananas.  Applesauce.  Rice.  Low-fat (lean) meats.  Toast.  Crackers.  Avoid fluids that have a lot of sugar or caffeine in them.  Avoid alcohol.  Avoid spicy or fatty foods. General instructions   Drink enough fluid to keep your pee (urine) clear or pale yellow.  Wash your hands often. If you cannot use soap and water, use hand sanitizer.  Make sure that all people in your home wash their hands well and often.  Rest at home while you get better.  Take over-the-counter and prescription medicines only as told by your doctor.  Watch your condition for any changes.  Take a warm bath to help with any burning or pain from having watery poop.  Keep all follow-up visits as told by your doctor. This is important. Contact a doctor if:  You cannot keep fluids down.  Your symptoms get worse.  You have new symptoms.  You feel light-headed or dizzy.  You have muscle cramps. Get help right away if:  You have chest pain.  You feel very weak or you pass out (faint).  You see blood in your throw-up.  Your throw-up looks like coffee grounds.  You  have bloody or black poop (stools) or poop that look like tar.  You have a very bad headache, a stiff neck, or both.  You have a rash.  You have very bad pain, cramping, or bloating in your belly (abdomen).  You have trouble breathing.  You are breathing very quickly.  Your heart is beating very quickly.  Your skin feels cold and clammy.  You feel confused.  You have pain when you pee.  You have signs of dehydration, such as:  Dark pee, hardly any pee, or  no pee.  Cracked lips.  Dry mouth.  Sunken eyes.  Sleepiness.  Weakness. This information is not intended to replace advice given to you by your health care provider. Make sure you discuss any questions you have with your health care provider. Document Released: 01/20/2008 Document Revised: 02/21/2016 Document Reviewed: 04/09/2015 Elsevier Interactive Patient Education  2017 [employer]

## 2016-11-04 ENCOUNTER — Encounter: Payer: Self-pay | Admitting: Family Medicine

## 2016-11-04 ENCOUNTER — Ambulatory Visit (INDEPENDENT_AMBULATORY_CARE_PROVIDER_SITE_OTHER): Payer: BLUE CROSS/BLUE SHIELD | Admitting: Family Medicine

## 2016-11-04 VITALS — BP 124/79 | HR 86 | Temp 98.0°F | Resp 16 | Ht 71.0 in | Wt 225.8 lb

## 2016-11-04 DIAGNOSIS — Z0000 Encounter for general adult medical examination without abnormal findings: Secondary | ICD-10-CM

## 2016-11-04 NOTE — Progress Notes (Signed)
Chief Complaint  Patient presents with  . Annual Exam    Subjective:  Dawn Brown is a 31 y.o. female here for a health maintenance visit.  Patient is established pt  Patient Active Problem List   Diagnosis Date Noted  . Depression, major, recurrent, moderate ([place]) 07/30/2015    Class: Chronic    Past Medical History:  Diagnosis Date  . Allergy   . Anxiety   . Depression   . Substance abuse     Past Surgical History:  Procedure Laterality Date  . APPENDECTOMY       Outpatient Medications Prior to Visit  Medication Sig Dispense Refill  . LORazepam (ATIVAN) 2 MG tablet Take 2 mg by mouth every 6 (six) hours as needed for anxiety.    Marland Kitchen lurasidone (LATUDA) 40 MG TABS tablet Take 80 mg by mouth daily with breakfast. Take 60 mg daily     . rOPINIRole (REQUIP) 2 MG tablet Take 2 mg by mouth at bedtime.    . ondansetron (ZOFRAN) 4 MG tablet Take 1-2 tablets (4-8 mg total) by mouth every 8 (eight) hours as needed for nausea or vomiting. (Patient not taking: Reported on 11/04/2016) 20 tablet 0  . ranitidine (ZANTAC) 150 MG tablet Take 1 tablet (150 mg total) by mouth 2 (two) times daily. (Patient not taking: Reported on 11/04/2016) 30 tablet 0   No facility-administered medications prior to visit.     Allergies  Allergen Reactions  . Penicillins      Family History  Problem Relation Age of Onset  . Alcohol abuse Paternal Grandfather   . Cancer Paternal Grandfather   . Heart disease Maternal Grandmother   . Heart disease Maternal Grandfather   . Diabetes Paternal Grandmother   . Heart disease Paternal Grandmother   . Thyroid disease Mother      Health Habits: Dental Exam: not up to date Eye Exam: not up to date   Social History   Social History  . Marital status: Single    Spouse name: N/A  . Number of children: 0  . Years of education: Master's in Social Work   Occupational History  . 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 Smoking status: Current Every Day Smoker    Packs/day: 0.50    Years: 14.00    Types: Cigarettes  . Smokeless tobacco: Never Used  . Alcohol use 1.2 oz/week    2 Standard drinks or equivalent per week     Comment: occasional ETOH  . Drug use: No     Comment: Last use of cocaine was March/April 2016  . Sexual activity: Not Currently   Other Topics Concern  . Not on file   Social History Narrative   Lives with her girlfriend.   History  Alcohol Use  . 1.2 oz/week  . 2 Standard drinks or equivalent per week    Comment: occasional ETOH   History  Smoking Status  . Current Every Day Smoker  . Packs/day: 0.50  . Years: 14.00  . Types: Cigarettes  Smokeless Tobacco  . Never Used   History  Drug Use No    Comment: Last use of cocaine was March/April 2016    GYN: Sexual Health Menstrual status: regular menses Last pap smear: see HM section History of abnormal pap smears:  Sexually active: with female partner   Health Maintenance: See under health Maintenance activity for review of completion dates as well.  Immunization History  Administered Date(s) Administered  . PPD Test 10/31/2015, 08/24/2016    Depression screen [facility] 2/9 10/21/2016 08/24/2016 01/27/2016 10/31/2015  Decreased Interest 0 0 2 0  Down, Depressed, Hopeless 0 0 2 0  PHQ - 2 Score 0 0 4 0  Altered sleeping - - 2 -  Tired, decreased energy - - 2 -  Change in appetite - - 0 -  Feeling bad or failure about yourself  - - 0 -  Trouble concentrating - - 0 -  Moving slowly or fidgety/restless - - 0 -  Suicidal thoughts - - 0 -  PHQ-9 Score - - 8 -  Difficult doing work/chores - - Somewhat difficult -  Some encounter information is confidential and restricted. Go to Review Flowsheets activity to see all data.      Depression Severity and Treatment Recommendations:  0-4= None  5-9= Mild / Treatment: Support, educate to call if worse; return in one month  10-14= Moderate /  Treatment: Support, watchful waiting; Antidepressant or Psycotherapy  15-19= Moderately severe / Treatment: Antidepressant OR Psychotherapy  >= 20 = Major depression, severe / Antidepressant AND Psychotherapy    Review of Systems   Review of Systems  Constitutional: Negative for chills, fever, malaise/fatigue and weight loss.  HENT: Negative for ear discharge, ear pain, hearing loss and tinnitus.   Eyes: Negative for blurred vision, double vision and photophobia.  Respiratory: Negative for cough, hemoptysis and wheezing.   Cardiovascular: Negative for chest pain, palpitations and leg swelling.  Gastrointestinal: Negative for abdominal pain, diarrhea, nausea and vomiting.  Genitourinary: Negative for dysuria, frequency, hematuria and urgency.  Musculoskeletal: Negative for back pain, myalgias and neck pain.  Skin: Negative for itching and rash.  Neurological: Negative for dizziness, tingling and headaches.  Psychiatric/Behavioral: Negative for depression. The patient is not nervous/anxious and does not have insomnia.     See HPI for ROS as well.    Objective:   Vitals:   11/04/16 1452  BP: 124/79  Pulse: 86  Resp: 16  Temp: 98 F (36.7 C)  TempSrc: Oral  SpO2: 97%  Weight: 225 lb 12.8 oz (102.4 kg)  Height: '5\' 11"'  (1.803 m)    Body mass index is 31.49 kg/m.  Physical Exam  Constitutional: She is oriented to person, place, and time. She appears well-developed and well-nourished.  HENT:  Head: Normocephalic and atraumatic.  Right Ear: External ear normal.  Left Ear: External ear normal.  Nose: Nose normal.  Mouth/Throat: Oropharynx is clear and moist.  Eyes: Conjunctivae and EOM are normal.  Neck: Normal range of motion. Neck supple. No thyromegaly present.  Cardiovascular: Normal rate, regular rhythm and normal heart sounds.   Pulmonary/Chest: Effort normal and breath sounds normal. No respiratory distress. She has no wheezes. She has no rales.  Abdominal: Soft.  Bowel sounds are normal. She exhibits no distension. There is no tenderness. There is no rebound.  Musculoskeletal: Normal range of motion. She exhibits no edema.  Neurological: She is alert and oriented to person, place, and time. She has normal reflexes.  Skin: Skin is warm. No erythema.  Psychiatric: She has a normal mood and affect. Her behavior is normal. Judgment and thought content normal.       Assessment/Plan:   Patient was seen for a health maintenance exam.  Counseled the patient on health maintenance issues. Reviewed her health mainteance schedule and ordered appropriate tests (see orders.) Counseled on regular exercise and weight management. Recommend regular eye exams and  dental cleaning.   The following issues were addressed today for health maintenance:   Macayla was seen today for annual exam.  Diagnoses and all orders for this visit:  Encounter for health maintenance examination in adult - discussed cervical cancer screening and explained that every woman with a cervix still has risk for cervical cancer due to mutation of the cervical cells She declined pap smear  -     CBC with Differential/Platelet -     Lipid panel -     TSH    No Follow-up on file.    Body mass index is 31.49 kg/m.:  Discussed the patient's BMI with patient. The BMI body mass index is 31.49 kg/m.     No future appointments.  Patient Instructions       IF you received an x-ray today, you will receive an invoice from [facility]. Please contact [facility] at [phone] with questions or concerns regarding your invoice.   IF you received labwork today, you will receive an invoice from [facility]. Please contact [facility] at [phone] with questions or concerns regarding your invoice.   Our billing staff will not be able to assist you with questions regarding bills from these companies.  You will be contacted with the lab results as soon as they are  available. The fastest way to get your results is to activate your My Chart account. Instructions are located on the last page of this paperwork. If you have not heard from [place] regarding the results in 2 weeks, please contact this office.     Health Maintenance, Female Adopting a healthy lifestyle and getting preventive care can go a long way to promote health and wellness. Talk with your health care provider about what schedule of regular examinations is right for you. This is a good chance for you to check in with your provider about disease prevention and staying healthy. In between checkups, there are plenty of things you can do on your own. Experts have done a lot of research about which lifestyle changes and preventive measures are most likely to keep you healthy. Ask your health care provider for more information. Weight and diet Eat a healthy diet  Be sure to include plenty of vegetables, fruits, low-fat dairy products, and lean protein.  Do not eat a lot of foods high in solid fats, added sugars, or salt.  Get regular exercise. This is one of the most important things you can do for your health.  Most adults should exercise for at least 150 minutes each week. The exercise should increase your heart rate and make you sweat (moderate-intensity exercise).  Most adults should also do strengthening exercises at least twice a week. This is in addition to the moderate-intensity exercise. Maintain a healthy weight  Body mass index (BMI) is a measurement that can be used to identify possible weight problems. It estimates body fat based on height and weight. Your health care provider can help determine your BMI and help you achieve or maintain a healthy weight.  For females 99 years of age and older:  A BMI below 18.5 is considered underweight.  A BMI of 18.5 to 24.9 is normal.  A BMI of 25 to 29.9 is considered overweight.  A BMI of 30 and above is considered obese. Watch levels of  cholesterol and blood lipids  You should start having your blood tested for lipids and cholesterol at 31 years of age, then have this test every 5 years.  You may  need to have your cholesterol levels checked more often if:  Your lipid or cholesterol levels are high.  You are older than 31 years of age.  You are at high risk for heart disease. Cancer screening Lung Cancer  Lung cancer screening is recommended for adults 3-39 years old who are at high risk for lung cancer because of a history of smoking.  A yearly low-dose CT scan of the lungs is recommended for people who:  Currently smoke.  Have quit within the past 15 years.  Have at least a 30-pack-year history of smoking. A pack year is smoking an average of one pack of cigarettes a day for 1 year.  Yearly screening should continue until it has been 15 years since you quit.  Yearly screening should stop if you develop a health problem that would prevent you from having lung cancer treatment. Breast Cancer  Practice breast self-awareness. This means understanding how your breasts normally appear and feel.  It also means doing regular breast self-exams. Let your health care provider know about any changes, no matter how small.  If you are in your 20s or 30s, you should have a clinical breast exam (CBE) by a health care provider every 1-3 years as part of a regular health exam.  If you are 83 or older, have a CBE every year. Also consider having a breast X-ray (mammogram) every year.  If you have a family history of breast cancer, talk to your health care provider about genetic screening.  If you are at high risk for breast cancer, talk to your health care provider about having an MRI and a mammogram every year.  Breast cancer gene (BRCA) assessment is recommended for women who have family members with BRCA-related cancers. BRCA-related cancers include:  Breast.  Ovarian.  Tubal.  Peritoneal cancers.  Results of  the assessment will determine the need for genetic counseling and BRCA1 and BRCA2 testing. Cervical Cancer  Your health care provider may recommend that you be screened regularly for cancer of the pelvic organs (ovaries, uterus, and vagina). This screening involves a pelvic examination, including checking for microscopic changes to the surface of your cervix (Pap test). You may be encouraged to have this screening done every 3 years, beginning at age 64.  For women ages 66-65, health care providers may recommend pelvic exams and Pap testing every 3 years, or they may recommend the Pap and pelvic exam, combined with testing for human papilloma virus (HPV), every 5 years. Some types of HPV increase your risk of cervical cancer. Testing for HPV may also be done on women of any age with unclear Pap test results.  Other health care providers may not recommend any screening for nonpregnant women who are considered low risk for pelvic cancer and who do not have symptoms. Ask your health care provider if a screening pelvic exam is right for you.  If you have had past treatment for cervical cancer or a condition that could lead to cancer, you need Pap tests and screening for cancer for at least 20 years after your treatment. If Pap tests have been discontinued, your risk factors (such as having a new sexual partner) need to be reassessed to determine if screening should resume. Some women have medical problems that increase the chance of getting cervical cancer. In these cases, your health care provider may recommend more frequent screening and Pap tests. Colorectal Cancer  This type of cancer can be detected and often prevented.  Routine colorectal  cancer screening usually begins at 31 years of age and continues through 31 years of age.  Your health care provider may recommend screening at an earlier age if you have risk factors for colon cancer.  Your health care provider may also recommend using home test  kits to check for hidden blood in the stool.  A small camera at the end of a tube can be used to examine your colon directly (sigmoidoscopy or colonoscopy). This is done to check for the earliest forms of colorectal cancer.  Routine screening usually begins at age 31.  Direct examination of the colon should be repeated every 5-10 years through 31 years of age. However, you may need to be screened more often if early forms of precancerous polyps or small growths are found. Skin Cancer  Check your skin from head to toe regularly.  Tell your health care provider about any new moles or changes in moles, especially if there is a change in a mole's shape or color.  Also tell your health care provider if you have a mole that is larger than the size of a pencil eraser.  Always use sunscreen. Apply sunscreen liberally and repeatedly throughout the day.  Protect yourself by wearing long sleeves, pants, a wide-brimmed hat, and sunglasses whenever you are outside. Heart disease, diabetes, and high blood pressure  High blood pressure causes heart disease and increases the risk of stroke. High blood pressure is more likely to develop in:  People who have blood pressure in the high end of the normal range (130-139/85-89 mm Hg).  People who are overweight or obese.  People who are African American.  If you are 56-28 years of age, have your blood pressure checked every 3-5 years. If you are 39 years of age or older, have your blood pressure checked every year. You should have your blood pressure measured twice-once when you are at a hospital or clinic, and once when you are not at a hospital or clinic. Record the average of the two measurements. To check your blood pressure when you are not at a hospital or clinic, you can use:  An automated blood pressure machine at a pharmacy.  A home blood pressure monitor.  If you are between 41 years and 32 years old, ask your health care provider if you should  take aspirin to prevent strokes.  Have regular diabetes screenings. This involves taking a blood sample to check your fasting blood sugar level.  If you are at a normal weight and have a low risk for diabetes, have this test once every three years after 31 years of age.  If you are overweight and have a high risk for diabetes, consider being tested at a younger age or more often. Preventing infection Hepatitis B  If you have a higher risk for hepatitis B, you should be screened for this virus. You are considered at high risk for hepatitis B if:  You were born in a country where hepatitis B is common. Ask your health care provider which countries are considered high risk.  Your parents were born in a high-risk country, and you have not been immunized against hepatitis B (hepatitis B vaccine).  You have HIV or AIDS.  You use needles to inject street drugs.  You live with someone who has hepatitis B.  You have had sex with someone who has hepatitis B.  You get hemodialysis treatment.  You take certain medicines for conditions, including cancer, organ transplantation, and autoimmune conditions.  Hepatitis C  Blood testing is recommended for:  Everyone born from [birth year] through 1965.  Anyone with known risk factors for hepatitis C. Sexually transmitted infections (STIs)  You should be screened for sexually transmitted infections (STIs) including gonorrhea and chlamydia if:  You are sexually active and are younger than 31 years of age.  You are older than 31 years of age and your health care provider tells you that you are at risk for this type of infection.  Your sexual activity has changed since you were last screened and you are at an increased risk for chlamydia or gonorrhea. Ask your health care provider if you are at risk.  If you do not have HIV, but are at risk, it may be recommended that you take a prescription medicine daily to prevent HIV infection. This is called  pre-exposure prophylaxis (PrEP). You are considered at risk if:  You are sexually active and do not regularly use condoms or know the HIV status of your partner(s).  You take drugs by injection.  You are sexually active with a partner who has HIV. Talk with your health care provider about whether you are at high risk of being infected with HIV. If you choose to begin PrEP, you should first be tested for HIV. You should then be tested every 3 months for as long as you are taking PrEP. Pregnancy  If you are premenopausal and you may become pregnant, ask your health care provider about preconception counseling.  If you may become pregnant, take 400 to 800 micrograms (mcg) of folic acid every day.  If you want to prevent pregnancy, talk to your health care provider about birth control (contraception). Osteoporosis and menopause  Osteoporosis is a disease in which the bones lose minerals and strength with aging. This can result in serious bone fractures. Your risk for osteoporosis can be identified using a bone density scan.  If you are 46 years of age or older, or if you are at risk for osteoporosis and fractures, ask your health care provider if you should be screened.  Ask your health care provider whether you should take a calcium or vitamin D supplement to lower your risk for osteoporosis.  Menopause may have certain physical symptoms and risks.  Hormone replacement therapy may reduce some of these symptoms and risks. Talk to your health care provider about whether hormone replacement therapy is right for you. Follow these instructions at home:  Schedule regular health, dental, and eye exams.  Stay current with your immunizations.  Do not use any tobacco products including cigarettes, chewing tobacco, or electronic cigarettes.  If you are pregnant, do not drink alcohol.  If you are breastfeeding, limit how much and how often you drink alcohol.  Limit alcohol intake to no more  than 1 drink per day for nonpregnant women. One drink equals 12 ounces of beer, 5 ounces of wine, or 1 ounces of hard liquor.  Do not use street drugs.  Do not share needles.  Ask your health care provider for help if you need support or information about quitting drugs.  Tell your health care provider if you often feel depressed.  Tell your health care provider if you have ever been abused or do not feel safe at home. This information is not intended to replace advice given to you by your health care provider. Make sure you discuss any questions you have with your health care provider. Document Released: 02/16/2011 Document Revised: 01/09/2016 Document Reviewed: 05/07/2015 Elsevier  Interactive Patient Education  2017 [employer].

## 2016-11-04 NOTE — Patient Instructions (Addendum)
IF you received an x-ray today, you will receive an invoice from [facility]: [facility]. Please contact [facility] at [phone] with questions or concerns regarding your invoice.   IF you received labwork today, you will receive an invoice from [facility]. Please contact [facility] at [phone] with questions or concerns regarding your invoice.   Our billing staff will not be able to assist you with questions regarding bills from these companies.  You will be contacted with the lab results as soon as they are available. The fastest way to get your results is to activate your My Chart account. Instructions are located on the last page of this paperwork. If you have not heard from [place] regarding the results in 2 weeks, please contact this office.     Health Maintenance, Female Adopting a healthy lifestyle and getting preventive care can go a long way to promote health and wellness. Talk with your health care provider about what schedule of regular examinations is right for you. This is a good chance for you to check in with your provider about disease prevention and staying healthy. In between checkups, there are plenty of things you can do on your own. Experts have done a lot of research about which lifestyle changes and preventive measures are most likely to keep you healthy. Ask your health care provider for more information. Weight and diet Eat a healthy diet  Be sure to include plenty of vegetables, fruits, low-fat dairy products, and lean protein.  Do not eat a lot of foods high in solid fats, added sugars, or salt.  Get regular exercise. This is one of the most important things you can do for your health.  Most adults should exercise for at least 150 minutes each week. The exercise should increase your heart rate and make you sweat (moderate-intensity exercise).  Most adults should also do strengthening exercises at least twice a week. This is in addition to the  moderate-intensity exercise. Maintain a healthy weight  Body mass index (BMI) is a measurement that can be used to identify possible weight problems. It estimates body fat based on height and weight. Your health care provider can help determine your BMI and help you achieve or maintain a healthy weight.  For females 93 years of age and older:  A BMI below 18.5 is considered underweight.  A BMI of 18.5 to 24.9 is normal.  A BMI of 25 to 29.9 is considered overweight.  A BMI of 30 and above is considered obese. Watch levels of cholesterol and blood lipids  You should start having your blood tested for lipids and cholesterol at 31 years of age, then have this test every 5 years.  You may need to have your cholesterol levels checked more often if:  Your lipid or cholesterol levels are high.  You are older than 31 years of age.  You are at high risk for heart disease. Cancer screening Lung Cancer  Lung cancer screening is recommended for adults 100-64 years old who are at high risk for lung cancer because of a history of smoking.  A yearly low-dose CT scan of the lungs is recommended for people who:  Currently smoke.  Have quit within the past 15 years.  Have at least a 30-pack-year history of smoking. A pack year is smoking an average of one pack of cigarettes a day for 1 year.  Yearly screening should continue until it has been 15 years since you quit.  Yearly screening should stop  if you develop a health problem that would prevent you from having lung cancer treatment. Breast Cancer  Practice breast self-awareness. This means understanding how your breasts normally appear and feel.  It also means doing regular breast self-exams. Let your health care provider know about any changes, no matter how small.  If you are in your 20s or 30s, you should have a clinical breast exam (CBE) by a health care provider every 1-3 years as part of a regular health exam.  If you are 79 or  older, have a CBE every year. Also consider having a breast X-ray (mammogram) every year.  If you have a family history of breast cancer, talk to your health care provider about genetic screening.  If you are at high risk for breast cancer, talk to your health care provider about having an MRI and a mammogram every year.  Breast cancer gene (BRCA) assessment is recommended for women who have family members with BRCA-related cancers. BRCA-related cancers include:  Breast.  Ovarian.  Tubal.  Peritoneal cancers.  Results of the assessment will determine the need for genetic counseling and BRCA1 and BRCA2 testing. Cervical Cancer  Your health care provider may recommend that you be screened regularly for cancer of the pelvic organs (ovaries, uterus, and vagina). This screening involves a pelvic examination, including checking for microscopic changes to the surface of your cervix (Pap test). You may be encouraged to have this screening done every 3 years, beginning at age 64.  For women ages 74-65, health care providers may recommend pelvic exams and Pap testing every 3 years, or they may recommend the Pap and pelvic exam, combined with testing for human papilloma virus (HPV), every 5 years. Some types of HPV increase your risk of cervical cancer. Testing for HPV may also be done on women of any age with unclear Pap test results.  Other health care providers may not recommend any screening for nonpregnant women who are considered low risk for pelvic cancer and who do not have symptoms. Ask your health care provider if a screening pelvic exam is right for you.  If you have had past treatment for cervical cancer or a condition that could lead to cancer, you need Pap tests and screening for cancer for at least 20 years after your treatment. If Pap tests have been discontinued, your risk factors (such as having a new sexual partner) need to be reassessed to determine if screening should resume. Some  women have medical problems that increase the chance of getting cervical cancer. In these cases, your health care provider may recommend more frequent screening and Pap tests. Colorectal Cancer  This type of cancer can be detected and often prevented.  Routine colorectal cancer screening usually begins at 31 years of age and continues through 31 years of age.  Your health care provider may recommend screening at an earlier age if you have risk factors for colon cancer.  Your health care provider may also recommend using home test kits to check for hidden blood in the stool.  A small camera at the end of a tube can be used to examine your colon directly (sigmoidoscopy or colonoscopy). This is done to check for the earliest forms of colorectal cancer.  Routine screening usually begins at age 45.  Direct examination of the colon should be repeated every 5-10 years through 30 years of age. However, you may need to be screened more often if early forms of precancerous polyps or small growths are found.  Skin Cancer  Check your skin from head to toe regularly.  Tell your health care provider about any new moles or changes in moles, especially if there is a change in a mole's shape or color.  Also tell your health care provider if you have a mole that is larger than the size of a pencil eraser.  Always use sunscreen. Apply sunscreen liberally and repeatedly throughout the day.  Protect yourself by wearing long sleeves, pants, a wide-brimmed hat, and sunglasses whenever you are outside. Heart disease, diabetes, and high blood pressure  High blood pressure causes heart disease and increases the risk of stroke. High blood pressure is more likely to develop in:  People who have blood pressure in the high end of the normal range (130-139/85-89 mm Hg).  People who are overweight or obese.  People who are African American.  If you are 18-39 years of age, have your blood pressure checked every  3-5 years. If you are 40 years of age or older, have your blood pressure checked every year. You should have your blood pressure measured twice-once when you are at a hospital or clinic, and once when you are not at a hospital or clinic. Record the average of the two measurements. To check your blood pressure when you are not at a hospital or clinic, you can use:  An automated blood pressure machine at a pharmacy.  A home blood pressure monitor.  If you are between 55 years and 79 years old, ask your health care provider if you should take aspirin to prevent strokes.  Have regular diabetes screenings. This involves taking a blood sample to check your fasting blood sugar level.  If you are at a normal weight and have a low risk for diabetes, have this test once every three years after 31 years of age.  If you are overweight and have a high risk for diabetes, consider being tested at a younger age or more often. Preventing infection Hepatitis B  If you have a higher risk for hepatitis B, you should be screened for this virus. You are considered at high risk for hepatitis B if:  You were born in a country where hepatitis B is common. Ask your health care provider which countries are considered high risk.  Your parents were born in a high-risk country, and you have not been immunized against hepatitis B (hepatitis B vaccine).  You have HIV or AIDS.  You use needles to inject street drugs.  You live with someone who has hepatitis B.  You have had sex with someone who has hepatitis B.  You get hemodialysis treatment.  You take certain medicines for conditions, including cancer, organ transplantation, and autoimmune conditions. Hepatitis C  Blood testing is recommended for:  Everyone born from 1945 through 1965.  Anyone with known risk factors for hepatitis C. Sexually transmitted infections (STIs)  You should be screened for sexually transmitted infections (STIs) including  gonorrhea and chlamydia if:  You are sexually active and are younger than 31 years of age.  You are older than 31 years of age and your health care provider tells you that you are at risk for this type of infection.  Your sexual activity has changed since you were last screened and you are at an increased risk for chlamydia or gonorrhea. Ask your health care provider if you are at risk.  If you do not have HIV, but are at risk, it may be recommended that you take a prescription   medicine daily to prevent HIV infection. This is called pre-exposure prophylaxis (PrEP). You are considered at risk if:  You are sexually active and do not regularly use condoms or know the HIV status of your partner(s).  You take drugs by injection.  You are sexually active with a partner who has HIV. Talk with your health care provider about whether you are at high risk of being infected with HIV. If you choose to begin PrEP, you should first be tested for HIV. You should then be tested every 3 months for as long as you are taking PrEP. Pregnancy  If you are premenopausal and you may become pregnant, ask your health care provider about preconception counseling.  If you may become pregnant, take 400 to 800 micrograms (mcg) of folic acid every day.  If you want to prevent pregnancy, talk to your health care provider about birth control (contraception). Osteoporosis and menopause  Osteoporosis is a disease in which the bones lose minerals and strength with aging. This can result in serious bone fractures. Your risk for osteoporosis can be identified using a bone density scan.  If you are 72 years of age or older, or if you are at risk for osteoporosis and fractures, ask your health care provider if you should be screened.  Ask your health care provider whether you should take a calcium or vitamin D supplement to lower your risk for osteoporosis.  Menopause may have certain physical symptoms and risks.  Hormone  replacement therapy may reduce some of these symptoms and risks. Talk to your health care provider about whether hormone replacement therapy is right for you. Follow these instructions at home:  Schedule regular health, dental, and eye exams.  Stay current with your immunizations.  Do not use any tobacco products including cigarettes, chewing tobacco, or electronic cigarettes.  If you are pregnant, do not drink alcohol.  If you are breastfeeding, limit how much and how often you drink alcohol.  Limit alcohol intake to no more than 1 drink per day for nonpregnant women. One drink equals 12 ounces of beer, 5 ounces of wine, or 1 ounces of hard liquor.  Do not use street drugs.  Do not share needles.  Ask your health care provider for help if you need support or information about quitting drugs.  Tell your health care provider if you often feel depressed.  Tell your health care provider if you have ever been abused or do not feel safe at home. This information is not intended to replace advice given to you by your health care provider. Make sure you discuss any questions you have with your health care provider. Document Released: 02/16/2011 Document Revised: 01/09/2016 Document Reviewed: 05/07/2015 Elsevier Interactive Patient Education  2017 [employer].

## 2016-11-05 ENCOUNTER — Encounter: Payer: Self-pay | Admitting: Family Medicine

## 2016-11-05 LAB — CBC WITH DIFFERENTIAL/PLATELET
Basophils Absolute: 0 10*3/uL (ref 0.0–0.2)
Basos: 0 %
EOS (ABSOLUTE): 0.1 10*3/uL (ref 0.0–0.4)
EOS: 1 %
HEMATOCRIT: 39.1 % (ref 34.0–46.6)
Hemoglobin: 12.8 g/dL (ref 11.1–15.9)
IMMATURE GRANS (ABS): 0 10*3/uL (ref 0.0–0.1)
IMMATURE GRANULOCYTES: 0 %
Lymphocytes Absolute: 1.4 10*3/uL (ref 0.7–3.1)
Lymphs: 17 %
MCH: 27.6 pg (ref 26.6–33.0)
MCHC: 32.7 g/dL (ref 31.5–35.7)
MCV: 84 fL (ref 79–97)
MONOS ABS: 0.6 10*3/uL (ref 0.1–0.9)
Monocytes: 7 %
NEUTROS ABS: 6.2 10*3/uL (ref 1.4–7.0)
NEUTROS PCT: 75 %
Platelets: 227 10*3/uL (ref 150–379)
RBC: 4.63 x10E6/uL (ref 3.77–5.28)
RDW: 13.9 % (ref 12.3–15.4)
WBC: 8.3 10*3/uL (ref 3.4–10.8)

## 2016-11-05 LAB — TSH: TSH: 1.58 u[IU]/mL (ref 0.450–4.500)

## 2016-11-05 LAB — LIPID PANEL
Chol/HDL Ratio: 3.8 ratio units (ref 0.0–4.4)
Cholesterol, Total: 175 mg/dL (ref 100–199)
HDL: 46 mg/dL (ref >39)
LDL Calculated: 95 mg/dL (ref 0–99)
Triglycerides: 170 mg/dL — ABNORMAL HIGH (ref 0–149)
VLDL CHOLESTEROL CAL: 34 mg/dL (ref 5–40)

## 2016-11-26 ENCOUNTER — Telehealth: Payer: Self-pay | Admitting: Family Medicine

## 2016-11-26 NOTE — Telephone Encounter (Signed)
Pt dropped off form to be filled out by Dr Creta Levin based on last CPE (11/04/16). Form to be placed in nurses box & copy given to pt.

## 2016-11-28 NOTE — Telephone Encounter (Signed)
Pt calling about forms I put form in [place] box

## 2018-01-01 IMAGING — DX DG FOOT COMPLETE 3+V*R*
3 series · 3 of 3 positions shown · non-contrast
Comparison: None.

CLINICAL DATA: Pain following inversion type injury

EXAM:
RIGHT FOOT COMPLETE - 3+ VIEW

[foot ap]
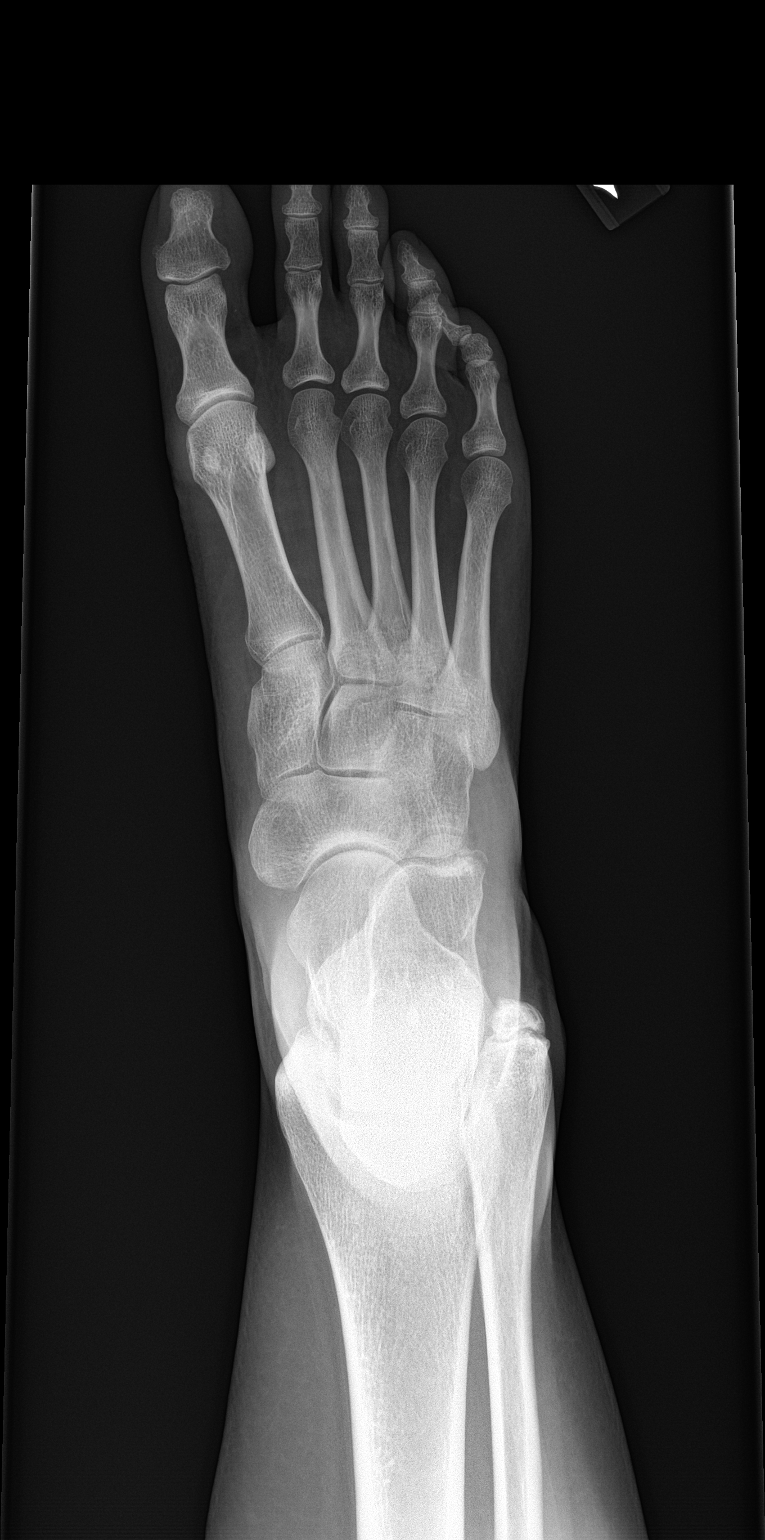

[foot obl]
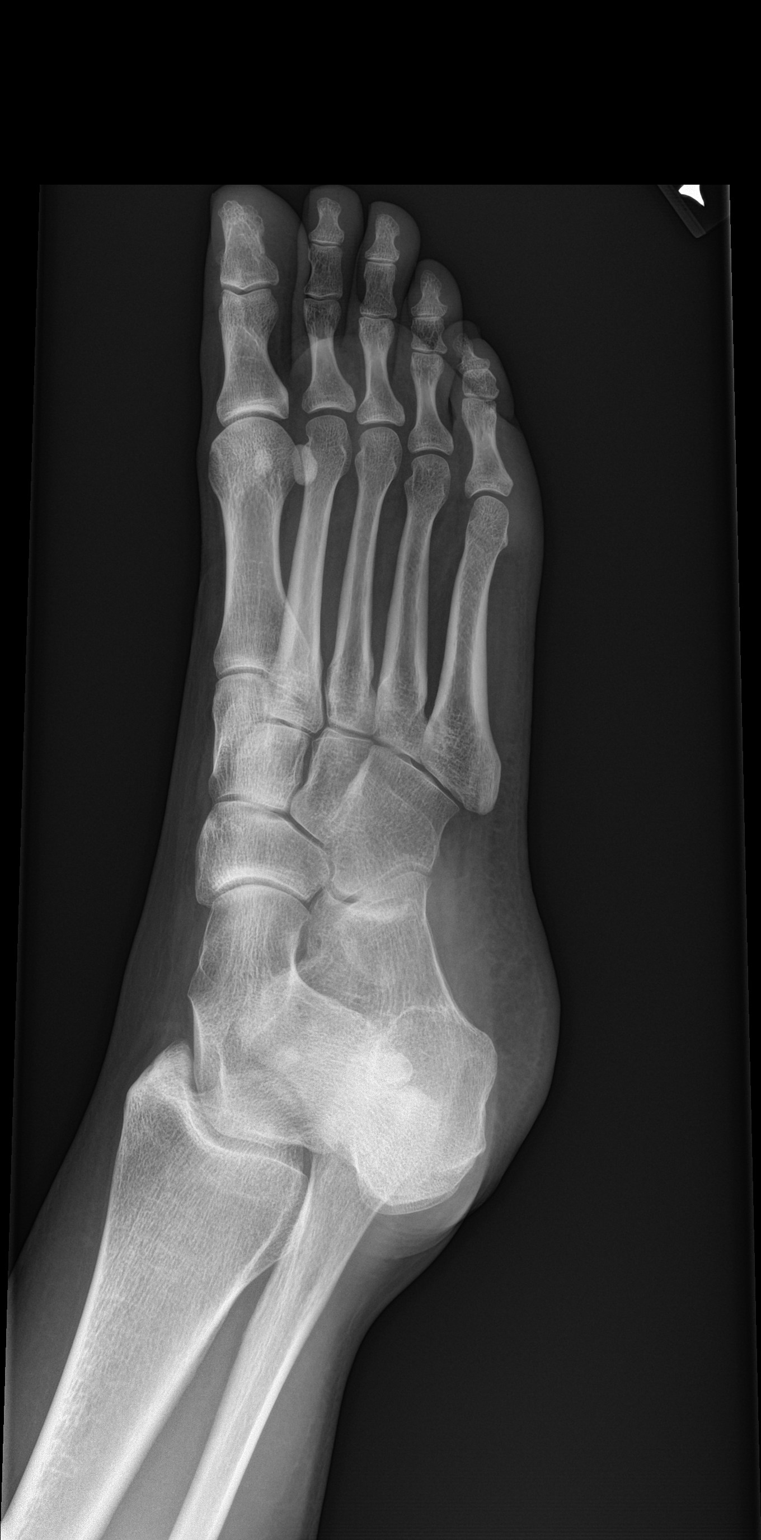

[foot lat]
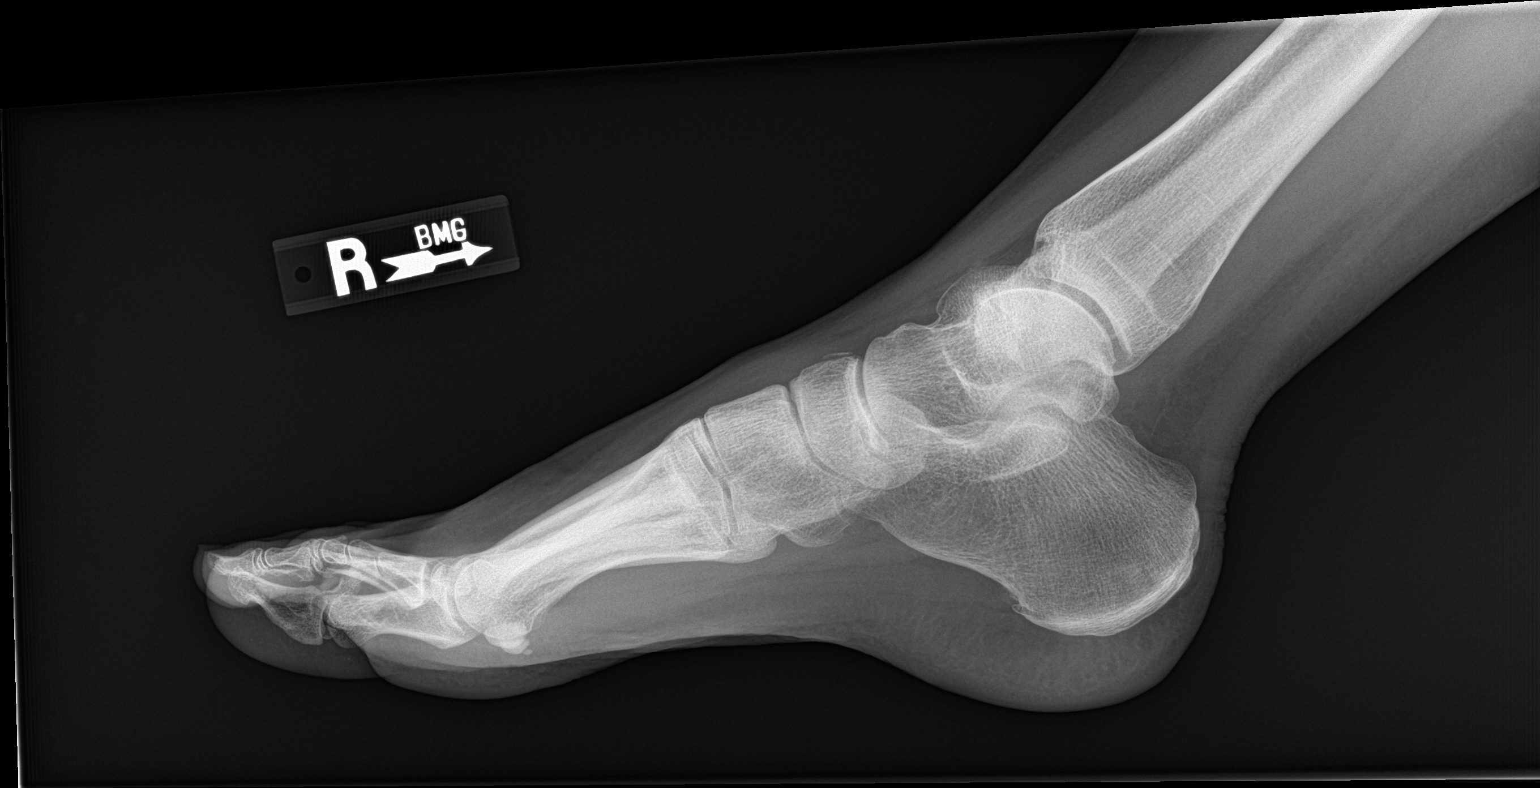

[3 of 3 positions shown; findings below may reference images not displayed]

FINDINGS: Frontal, oblique, and lateral views were obtained. There is an age
uncertain avulsion type injury in the lateral malleolar region with
mildly displaced fracture fragments. In the foot region, there is no
appreciable fracture or dislocation. The joint spaces appear normal.
There is a minimal inferior calcaneal spur.
IMPRESSION: Age uncertain fracture of the lateral malleolus with slight
displacement of fracture fragments. Correlation with ankle images
may be advisable in this regard. In the foot region, there is no
demonstrable fracture or dislocation. Joint spaces appear normal.
There is a minimal inferior calcaneal spur.

These results will be called to the ordering clinician or
representative by the Radiologist Assistant, and communication
documented in the PACS or zVision Dashboard.
